# Patient Record
Sex: Female | Born: 1960 | Race: White | Hispanic: No
Health system: Southern US, Community
[De-identification: ages and names within clinical notes are randomized; demographics above are authoritative.]

---

## 2014-04-11 ENCOUNTER — Inpatient Hospital Stay: Payer: Self-pay | Admitting: Internal Medicine

## 2014-04-11 LAB — COMPREHENSIVE METABOLIC PANEL
Albumin: 3.6 g/dL (ref 3.4–5.0)
Alkaline Phosphatase: 92 U/L
Anion Gap: 29 — ABNORMAL HIGH (ref 7–16)
BILIRUBIN TOTAL: 2.1 mg/dL — AB (ref 0.2–1.0)
BUN: 11 mg/dL (ref 7–18)
CALCIUM: 9.1 mg/dL (ref 8.5–10.1)
Chloride: 97 mmol/L — ABNORMAL LOW (ref 98–107)
Co2: 12 mmol/L — ABNORMAL LOW (ref 21–32)
Creatinine: 0.95 mg/dL (ref 0.60–1.30)
EGFR (Non-African Amer.): >60
Glucose: 80 mg/dL (ref 65–99)
Osmolality: 274 (ref 275–301)
Potassium: 1.9 mmol/L — CL (ref 3.5–5.1)
SGOT(AST): 177 U/L — ABNORMAL HIGH (ref 15–37)
SGPT (ALT): 54 U/L (ref 12–78)
SODIUM: 138 mmol/L (ref 136–145)
Total Protein: 7.6 g/dL (ref 6.4–8.2)

## 2014-04-11 LAB — ETHANOL: Ethanol %: <0.003 % (ref 0.000–0.080)

## 2014-04-11 LAB — CBC
HCT: 33.2 % — ABNORMAL LOW (ref 35.0–47.0)
HGB: 11.1 g/dL — ABNORMAL LOW (ref 12.0–16.0)
MCH: 38.3 pg — AB (ref 26.0–34.0)
MCHC: 33.4 g/dL (ref 32.0–36.0)
MCV: 115 fL — AB (ref 80–100)
Platelet: 111 10*3/uL — ABNORMAL LOW (ref 150–440)
RBC: 2.89 10*6/uL — AB (ref 3.80–5.20)
RDW: 15.4 % — ABNORMAL HIGH (ref 11.5–14.5)
WBC: 5.7 10*3/uL (ref 3.6–11.0)

## 2014-04-11 LAB — DRUG SCREEN, URINE

## 2014-04-11 LAB — LIPASE, BLOOD: Lipase: 2186 U/L — ABNORMAL HIGH (ref 73–393)

## 2014-04-11 LAB — ACETAMINOPHEN LEVEL

## 2014-04-11 LAB — URINALYSIS, COMPLETE
BACTERIA: NONE SEEN
Bilirubin,UR: NEGATIVE
GLUCOSE, UR: NEGATIVE mg/dL (ref 0–75)
LEUKOCYTE ESTERASE: NEGATIVE
NITRITE: NEGATIVE
Ph: 6 (ref 4.5–8.0)
Protein: 100
RBC,UR: 1 /HPF (ref 0–5)
Specific Gravity: 1.017 (ref 1.003–1.030)
Squamous Epithelial: 12
WBC UR: 3 /HPF (ref 0–5)

## 2014-04-11 LAB — IRON: Iron: 159 ug/dL (ref 50–170)

## 2014-04-11 LAB — MAGNESIUM: MAGNESIUM: 1.1 mg/dL — AB

## 2014-04-11 LAB — OSMOLALITY: OSMOLALITY: 303 mosm/kg — AB (ref 275–295)

## 2014-04-11 LAB — SALICYLATE LEVEL: SALICYLATES, SERUM: 4.7 mg/dL — AB

## 2014-04-12 LAB — LIPID PANEL
CHOLESTEROL: 118 mg/dL (ref 0–200)
HDL Cholesterol: 43 mg/dL (ref 40–60)
LDL CHOLESTEROL, CALC: 54 mg/dL (ref 0–100)
TRIGLYCERIDES: 104 mg/dL (ref 0–200)
VLDL Cholesterol, Calc: 21 mg/dL (ref 5–40)

## 2014-04-12 LAB — BASIC METABOLIC PANEL
Anion Gap: 23 — ABNORMAL HIGH (ref 7–16)
BUN: 6 mg/dL — ABNORMAL LOW (ref 7–18)
Calcium, Total: 8 mg/dL — ABNORMAL LOW (ref 8.5–10.1)
Chloride: 103 mmol/L (ref 98–107)
Co2: 13 mmol/L — ABNORMAL LOW (ref 21–32)
Creatinine: 0.51 mg/dL — ABNORMAL LOW (ref 0.60–1.30)
EGFR (African American): >60
EGFR (Non-African Amer.): >60
GLUCOSE: 68 mg/dL (ref 65–99)
Osmolality: 273 (ref 275–301)
POTASSIUM: 2.2 mmol/L — AB (ref 3.5–5.1)
Sodium: 139 mmol/L (ref 136–145)

## 2014-04-12 LAB — LIPASE, BLOOD: LIPASE: 3080 U/L — AB (ref 73–393)

## 2014-04-12 LAB — SALICYLATE LEVEL: SALICYLATES, SERUM: 4.5 mg/dL — AB

## 2014-04-12 LAB — TSH: Thyroid Stimulating Horm: 2.61 u[IU]/mL

## 2014-04-12 LAB — CK: CK, TOTAL: 1404 U/L — AB

## 2014-04-12 LAB — MAGNESIUM: Magnesium: 3.8 mg/dL — ABNORMAL HIGH

## 2014-04-12 LAB — POTASSIUM: POTASSIUM: 3.1 mmol/L — AB (ref 3.5–5.1)

## 2014-04-13 LAB — BASIC METABOLIC PANEL
ANION GAP: 8 (ref 7–16)
BUN: 9 mg/dL (ref 7–18)
CALCIUM: 7.8 mg/dL — AB (ref 8.5–10.1)
CHLORIDE: 108 mmol/L — AB (ref 98–107)
CREATININE: 0.53 mg/dL — AB (ref 0.60–1.30)
Co2: 26 mmol/L (ref 21–32)
EGFR (African American): >60
EGFR (Non-African Amer.): >60
Glucose: 129 mg/dL — ABNORMAL HIGH (ref 65–99)
OSMOLALITY: 284 (ref 275–301)
Potassium: 2.8 mmol/L — ABNORMAL LOW (ref 3.5–5.1)
Sodium: 142 mmol/L (ref 136–145)

## 2014-04-13 LAB — URINE CULTURE

## 2014-04-13 LAB — HEPATIC FUNCTION PANEL A (ARMC)
AST: 99 U/L — AB (ref 15–37)
Albumin: 2.5 g/dL — ABNORMAL LOW (ref 3.4–5.0)
Alkaline Phosphatase: 61 U/L
BILIRUBIN TOTAL: 0.8 mg/dL (ref 0.2–1.0)
Bilirubin, Direct: 0.3 mg/dL — ABNORMAL HIGH (ref 0.00–0.20)
SGPT (ALT): 35 U/L (ref 12–78)
TOTAL PROTEIN: 5.5 g/dL — AB (ref 6.4–8.2)

## 2014-04-13 LAB — CK: CK, Total: 1502 U/L — ABNORMAL HIGH

## 2014-04-13 LAB — LIPASE, BLOOD: Lipase: 4860 U/L — ABNORMAL HIGH (ref 73–393)

## 2014-04-14 LAB — BASIC METABOLIC PANEL
Anion Gap: 9 (ref 7–16)
BUN: 4 mg/dL — AB (ref 7–18)
Calcium, Total: 7.3 mg/dL — ABNORMAL LOW (ref 8.5–10.1)
Chloride: 104 mmol/L (ref 98–107)
Co2: 28 mmol/L (ref 21–32)
Creatinine: 0.41 mg/dL — ABNORMAL LOW (ref 0.60–1.30)
EGFR (African American): >60
Glucose: 93 mg/dL (ref 65–99)
Osmolality: 278 (ref 275–301)
POTASSIUM: 2.9 mmol/L — AB (ref 3.5–5.1)
Sodium: 141 mmol/L (ref 136–145)

## 2014-04-14 LAB — CBC WITH DIFFERENTIAL/PLATELET
BASOS PCT: 0.7 %
Basophil #: 0 10*3/uL (ref 0.0–0.1)
EOS PCT: 0.6 %
Eosinophil #: 0 10*3/uL (ref 0.0–0.7)
HCT: 27.6 % — ABNORMAL LOW (ref 35.0–47.0)
HGB: 8.9 g/dL — ABNORMAL LOW (ref 12.0–16.0)
LYMPHS ABS: 1.2 10*3/uL (ref 1.0–3.6)
Lymphocyte %: 31.8 %
MCH: 36.7 pg — ABNORMAL HIGH (ref 26.0–34.0)
MCHC: 32.2 g/dL (ref 32.0–36.0)
MCV: 114 fL — ABNORMAL HIGH (ref 80–100)
MONOS PCT: 8.3 %
Monocyte #: 0.3 x10 3/mm (ref 0.2–0.9)
Neutrophil #: 2.2 10*3/uL (ref 1.4–6.5)
Neutrophil %: 58.6 %
Platelet: 76 10*3/uL — ABNORMAL LOW (ref 150–440)
RBC: 2.42 10*6/uL — ABNORMAL LOW (ref 3.80–5.20)
RDW: 15.6 % — ABNORMAL HIGH (ref 11.5–14.5)
WBC: 3.8 10*3/uL (ref 3.6–11.0)

## 2014-04-14 LAB — CK: CK, TOTAL: 2634 U/L — AB

## 2014-04-14 LAB — MAGNESIUM: Magnesium: 1 mg/dL — ABNORMAL LOW

## 2014-04-14 LAB — LIPASE, BLOOD: Lipase: 7312 U/L — ABNORMAL HIGH (ref 73–393)

## 2014-04-15 LAB — CBC WITH DIFFERENTIAL/PLATELET
BASOS ABS: 0 10*3/uL (ref 0.0–0.1)
Basophil %: 0.8 %
Eosinophil #: 0 10*3/uL (ref 0.0–0.7)
Eosinophil %: 0.4 %
HCT: 29.7 % — AB (ref 35.0–47.0)
HGB: 9.9 g/dL — ABNORMAL LOW (ref 12.0–16.0)
LYMPHS ABS: 1.4 10*3/uL (ref 1.0–3.6)
Lymphocyte %: 34.7 %
MCH: 37.6 pg — AB (ref 26.0–34.0)
MCHC: 33.4 g/dL (ref 32.0–36.0)
MCV: 112 fL — AB (ref 80–100)
MONOS PCT: 6.4 %
Monocyte #: 0.3 x10 3/mm (ref 0.2–0.9)
NEUTROS ABS: 2.4 10*3/uL (ref 1.4–6.5)
Neutrophil %: 57.7 %
Platelet: 98 10*3/uL — ABNORMAL LOW (ref 150–440)
RBC: 2.64 10*6/uL — ABNORMAL LOW (ref 3.80–5.20)
RDW: 15.2 % — ABNORMAL HIGH (ref 11.5–14.5)
WBC: 4.1 10*3/uL (ref 3.6–11.0)

## 2014-04-15 LAB — BASIC METABOLIC PANEL
ANION GAP: 7 (ref 7–16)
BUN: 2 mg/dL — AB (ref 7–18)
CO2: 26 mmol/L (ref 21–32)
Calcium, Total: 7.6 mg/dL — ABNORMAL LOW (ref 8.5–10.1)
Chloride: 104 mmol/L (ref 98–107)
Creatinine: 0.41 mg/dL — ABNORMAL LOW (ref 0.60–1.30)
EGFR (African American): >60
EGFR (Non-African Amer.): >60
GLUCOSE: 88 mg/dL (ref 65–99)
Osmolality: 269 (ref 275–301)
POTASSIUM: 3.7 mmol/L (ref 3.5–5.1)
SODIUM: 137 mmol/L (ref 136–145)

## 2014-04-15 LAB — OCCULT BLOOD X 1 CARD TO LAB, STOOL: Occult Blood, Feces: POSITIVE

## 2014-04-15 LAB — CK: CK, Total: 4364 U/L — ABNORMAL HIGH

## 2014-04-15 LAB — LIPASE, BLOOD: LIPASE: 4651 U/L — AB (ref 73–393)

## 2014-04-15 LAB — DRUG SCREEN, URINE
Amphetamines, Ur Screen: NEGATIVE (ref ?–1000)
BARBITURATES, UR SCREEN: NEGATIVE (ref ?–200)
Benzodiazepine, Ur Scrn: NEGATIVE (ref ?–200)
CANNABINOID 50 NG, UR ~~LOC~~: NEGATIVE (ref ?–50)
Cocaine Metabolite,Ur ~~LOC~~: NEGATIVE (ref ?–300)
MDMA (ECSTASY) UR SCREEN: NEGATIVE (ref ?–500)
Methadone, Ur Screen: NEGATIVE (ref ?–300)
Opiate, Ur Screen: NEGATIVE (ref ?–300)
PHENCYCLIDINE (PCP) UR S: NEGATIVE (ref ?–25)
Tricyclic, Ur Screen: NEGATIVE (ref ?–1000)

## 2014-04-15 LAB — FOLATE: FOLIC ACID: 3.4 ng/mL (ref 3.1–100.0)

## 2014-04-15 LAB — MAGNESIUM: Magnesium: 1.6 mg/dL — ABNORMAL LOW

## 2014-04-16 LAB — BASIC METABOLIC PANEL
Anion Gap: 4 — ABNORMAL LOW (ref 7–16)
BUN: 3 mg/dL — ABNORMAL LOW (ref 7–18)
CALCIUM: 8.1 mg/dL — AB (ref 8.5–10.1)
CREATININE: 0.53 mg/dL — AB (ref 0.60–1.30)
Chloride: 109 mmol/L — ABNORMAL HIGH (ref 98–107)
Co2: 24 mmol/L (ref 21–32)
EGFR (African American): >60
EGFR (Non-African Amer.): >60
Glucose: 80 mg/dL (ref 65–99)
OSMOLALITY: 269 (ref 275–301)
Potassium: 5 mmol/L (ref 3.5–5.1)
Sodium: 137 mmol/L (ref 136–145)

## 2014-04-16 LAB — CK: CK, TOTAL: 2887 U/L — AB

## 2014-04-16 LAB — WBCS, STOOL

## 2014-04-16 LAB — HEMOGLOBIN: HGB: 8.7 g/dL — AB (ref 12.0–16.0)

## 2014-04-17 LAB — HEMOGLOBIN: HGB: 8.4 g/dL — AB (ref 12.0–16.0)

## 2014-04-17 LAB — BASIC METABOLIC PANEL
Anion Gap: 8 (ref 7–16)
BUN: 3 mg/dL — ABNORMAL LOW (ref 7–18)
CALCIUM: 8.3 mg/dL — AB (ref 8.5–10.1)
CHLORIDE: 107 mmol/L (ref 98–107)
CREATININE: 0.59 mg/dL — AB (ref 0.60–1.30)
Co2: 24 mmol/L (ref 21–32)
EGFR (African American): >60
EGFR (Non-African Amer.): >60
Glucose: 81 mg/dL (ref 65–99)
Osmolality: 273 (ref 275–301)
Potassium: 4 mmol/L (ref 3.5–5.1)
Sodium: 139 mmol/L (ref 136–145)

## 2014-04-17 LAB — MAGNESIUM: MAGNESIUM: 1.8 mg/dL

## 2014-04-17 LAB — CK: CK, Total: 1155 U/L — ABNORMAL HIGH

## 2014-04-17 LAB — LIPASE, BLOOD: LIPASE: 1797 U/L — AB (ref 73–393)

## 2014-04-18 LAB — STOOL CULTURE

## 2015-01-08 IMAGING — CT CT ABD-PELV W/ CM
2 of 5 series · 16 of 46 positions shown, 18 images · IV contrast (agent unspecified)
Comparison: None.

CLINICAL DATA: Nausea, vomiting common diarrhea. Elevated lipase.
Metabolic acidosis. Leg swelling.

EXAM:
CT ABDOMEN AND PELVIS WITH CONTRAST
TECHNIQUE: Multidetector CT imaging of the abdomen and pelvis was performed
using the standard protocol following bolus administration of
intravenous contrast.
CONTRAST:  100 cc [id number]

[Series 2: routine abd pel with · axial · 0.71mm/px · z∈[-426,-52]mm · 13 of 87 slices shown, 15 images]
[im 6/87  soft-tissue]
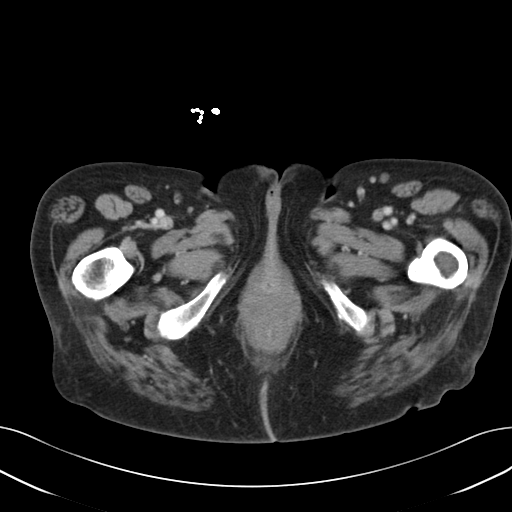
[im 6/87  bone]
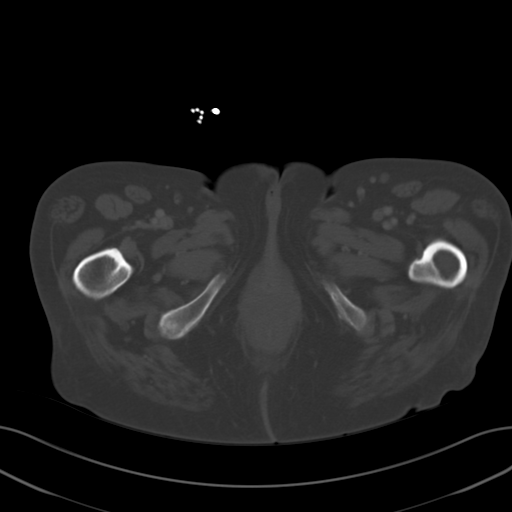
[im 11/87  soft-tissue]
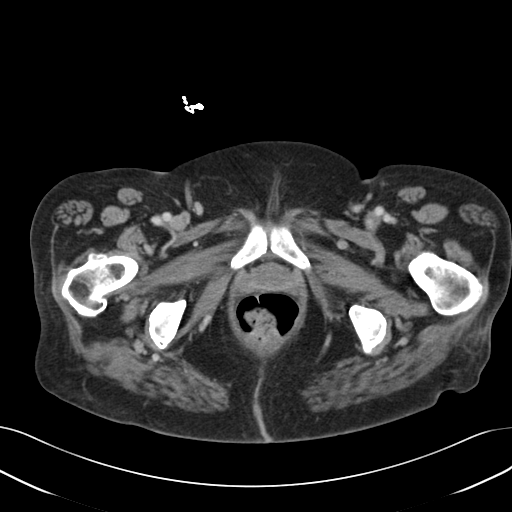
[im 21/87  soft-tissue]
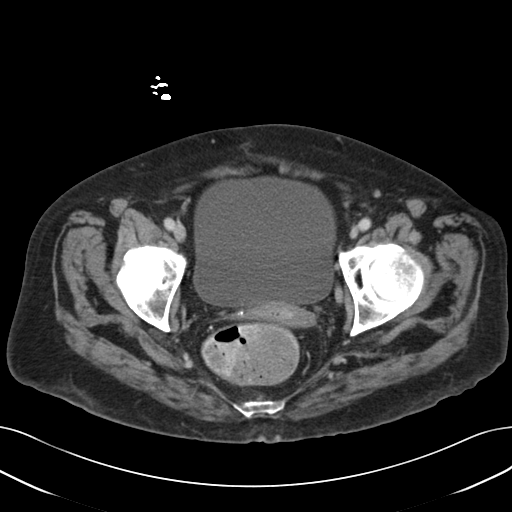
[im 26/87  soft-tissue]
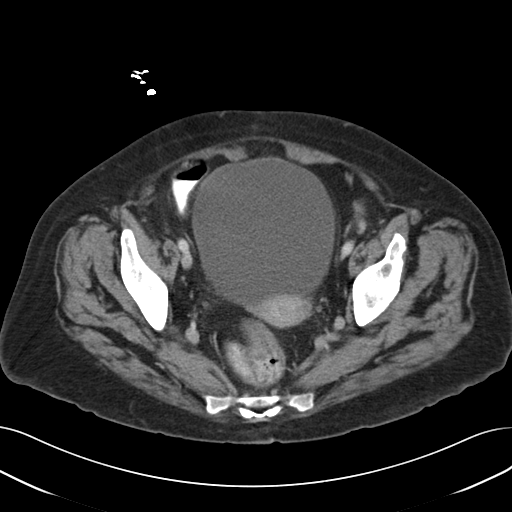
[im 31/87  soft-tissue]
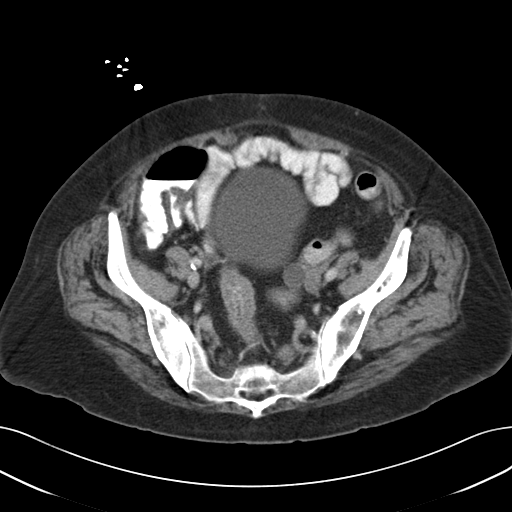
[im 36/87  soft-tissue]
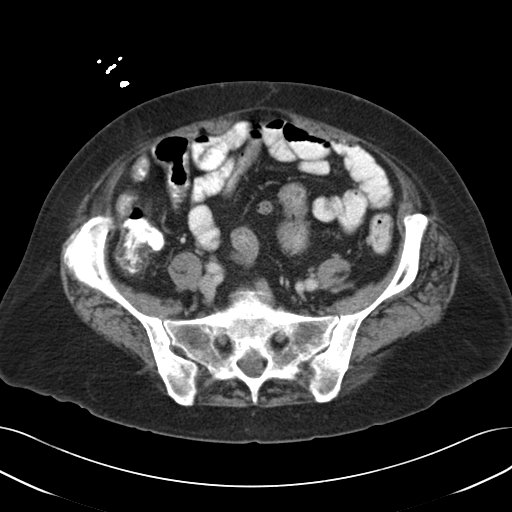
[im 46/87  soft-tissue]
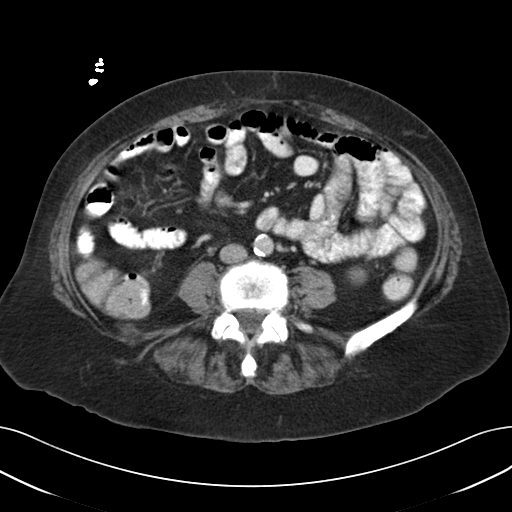
[im 51/87  soft-tissue]
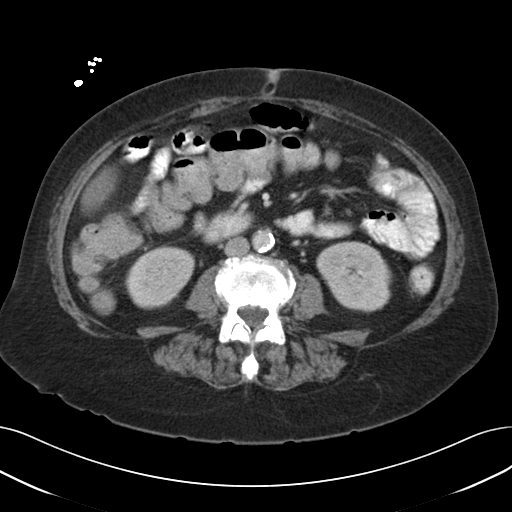
[im 56/87  soft-tissue]
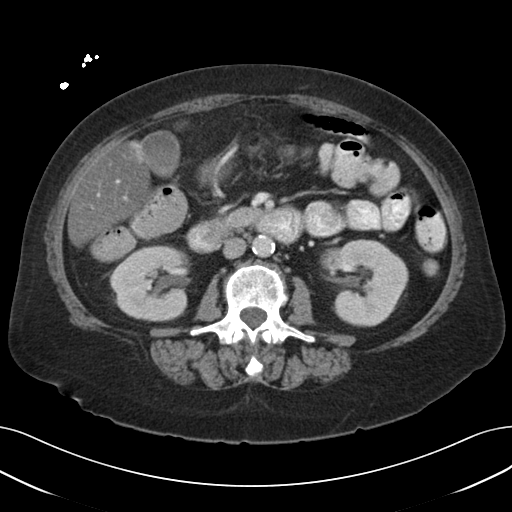
[im 56/87  bone]
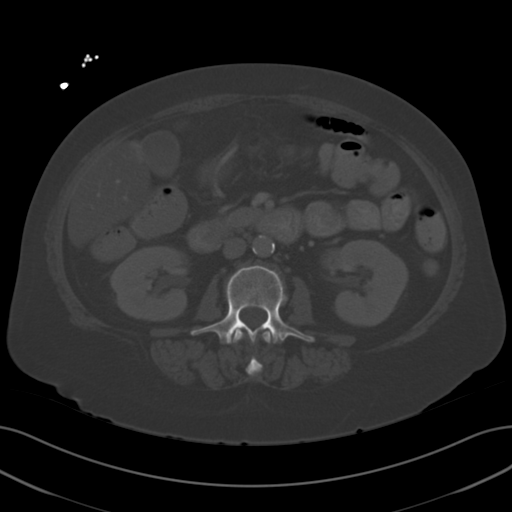
[im 61/87  soft-tissue]
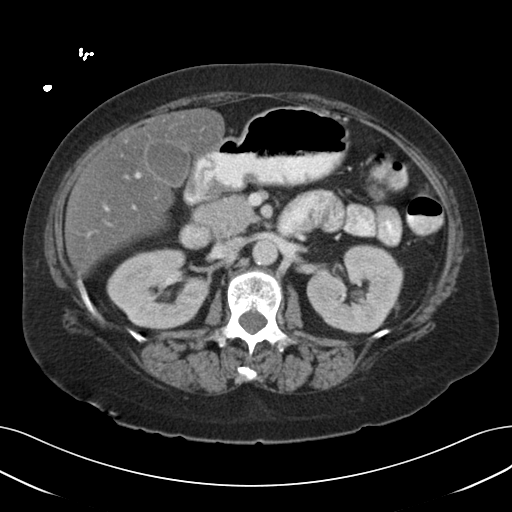
[im 66/87  soft-tissue]
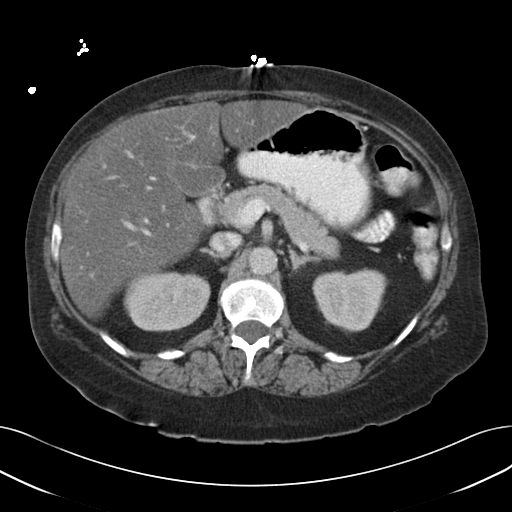
[im 76/87  soft-tissue]
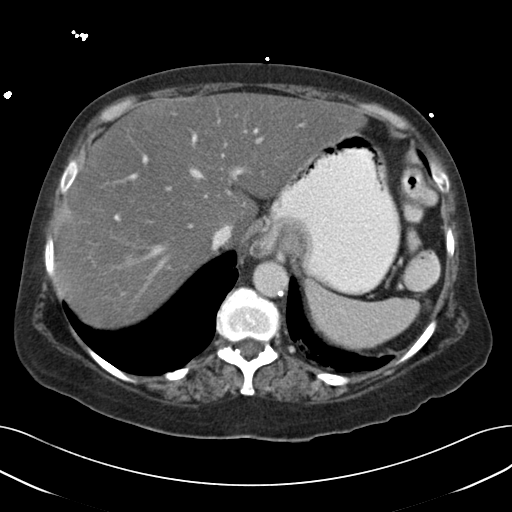
[im 81/87  soft-tissue]
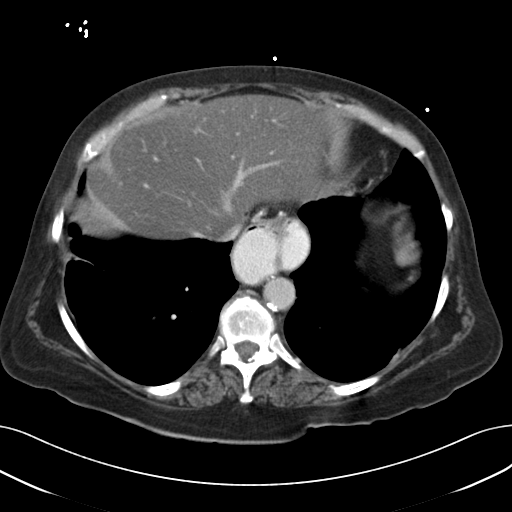

[Series 6: cor routine abd pel with · coronal · 0.68mm/px · 3 of 130 slices shown]
[im 44/130  soft-tissue]
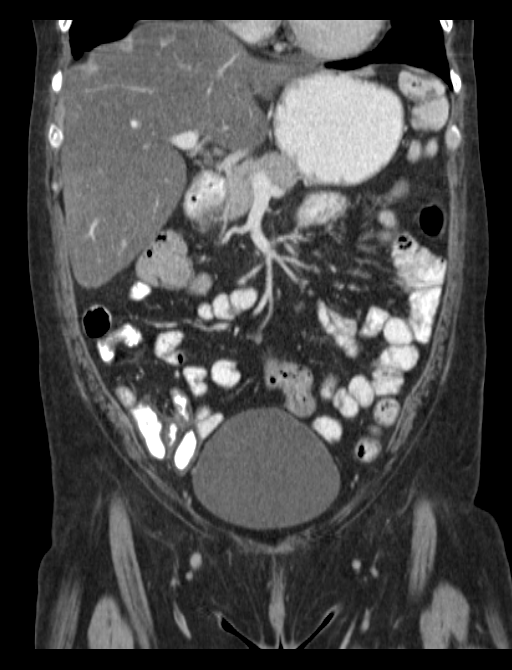
[im 58/130  soft-tissue]
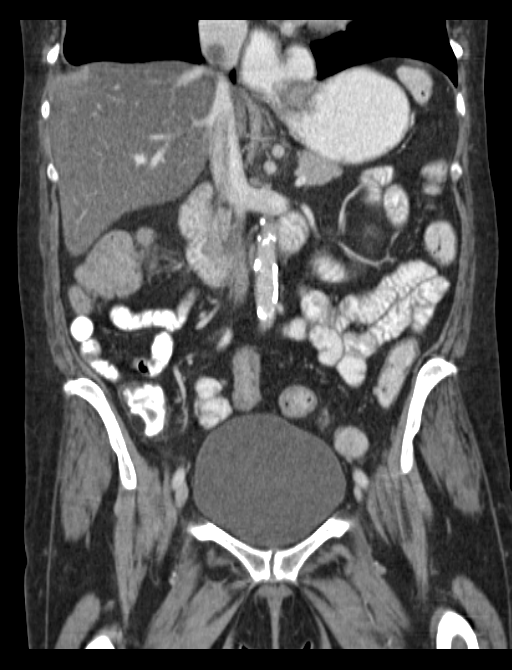
[im 72/130  soft-tissue]
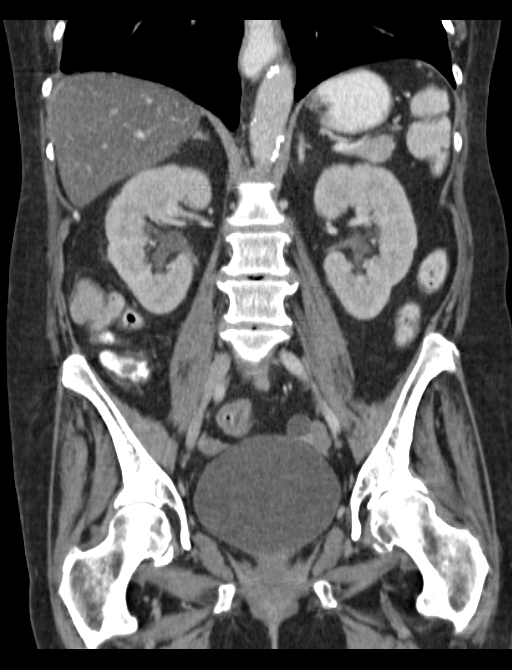

[16 of 46 positions shown; findings below may reference images not displayed]

FINDINGS: There is a moderate hiatal hernia. There is hepatomegaly with
extensive hepatic steatosis. No focal lesions. Solitary 12 mm
gallstone. No dilated bile ducts.

The spleen and pancreas appear normal. Adrenal glands and kidneys
are normal.

Bowel is normal except for a few diverticula in the sigmoid portion
of the colon. Terminal ileum and appendix are normal. Uterus is
normal. There is a 9 mm cyst on the otherwise normal right ovary and
a 15 mm cyst on the otherwise normal left ovary. Bladder is normal.
No acute osseous abnormality.
IMPRESSION: 1. Hepatomegaly with diffuse hepatic steatosis.
2. Cholelithiasis.  No bile duct dilatation.
3. Normal pancreas with no peripancreatic soft tissue stranding or
fluid. No pancreatic duct dilatation.
4. Moderate hiatal hernia.

## 2015-01-11 IMAGING — CR DG ABDOMEN 1V
1 series · 1 of 1 positions shown · non-contrast
Comparison: CT of 04/11/2014

CLINICAL DATA: Diarrhea for 2 months.  Nausea and abdominal pain.

EXAM:
ABDOMEN - 1 VIEW

[supine kub]
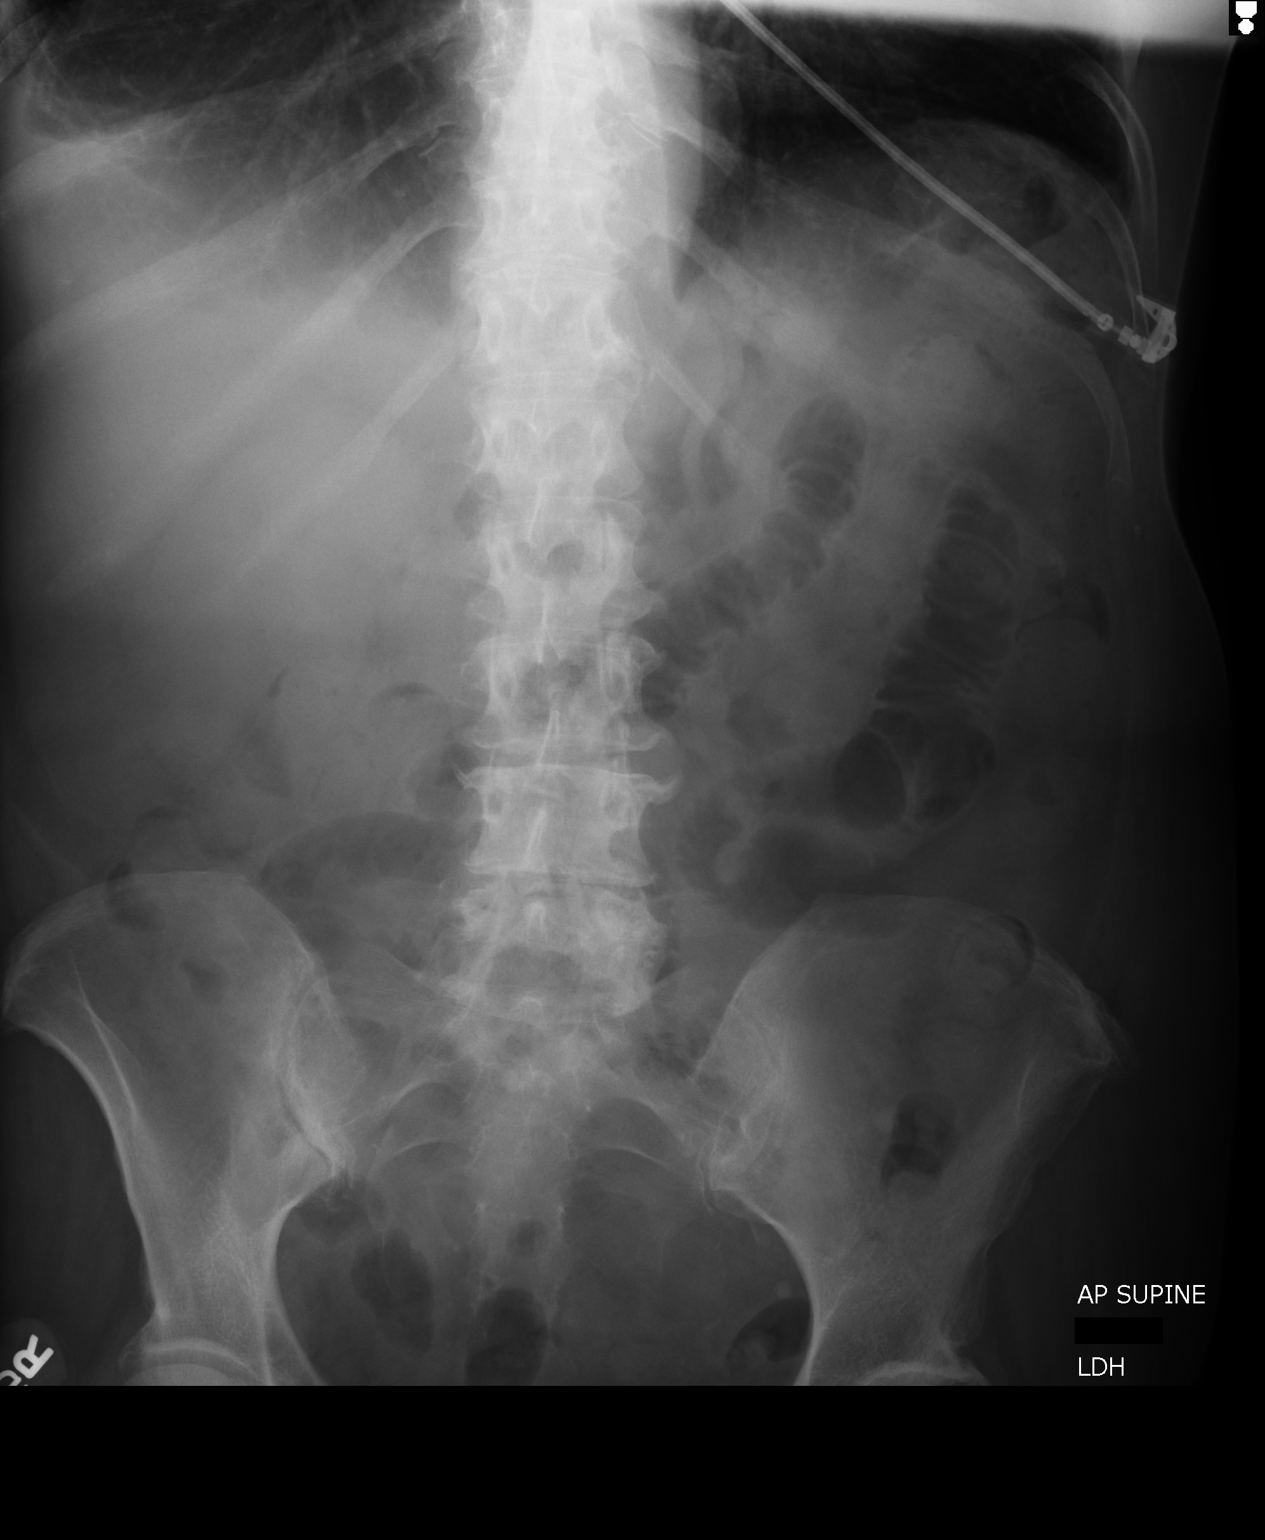

[1 of 1 positions shown; findings below may reference images not displayed]

FINDINGS: Supine view of the abdomen and pelvis. No bowel distension. No free
intraperitoneal air. No pneumatosis. No abnormal abdominal
calcifications. No appendicolith. Distal gas and stool.
IMPRESSION: No acute findings.

## 2015-02-09 NOTE — Consult Note (Signed)
Consult dictated.  I spoke to Dr. Juliene PinaMody about assessment. Rhabdomyolysis of unknown cause with rising Lipase without clinical evidence of pancreatitis.  Hx of alcohol use, drug screen neg.  Hx of anxiety spells, panic attacks, feeling spaced out at work.  Recommend continued iv supplementation with Mg and K and psychiatry consult,  Would search room for any meds she may be taking we are not aware of.  Electronic Signatures: Scot JunElliott, Robert T (MD)  (Signed on 27-Jun-15 14:22)  Authored  Last Updated: 27-Jun-15 14:22 by Scot JunElliott, Robert T (MD)

## 2015-02-09 NOTE — Consult Note (Signed)
PATIENT NAME:  ELHAM, FINI MR#:  [id number] DATE OF BIRTH:  1960/11/26  DATE OF CONSULTATION:  04/14/2014  CONSULTING PHYSICIAN:  Scot Jun, MD  HISTORY OF PRESENT ILLNESS: The patient is a 54 year old white female who was admitted to the hospital due to lower extremity swelling, severe pain in the left lower extremity and severe hypomagnesemia and hypokalemic. I was asked to see her for very elevated lipase test over 7000.   The patient is a 54 year old white female with history of alcohol use. She drinks Seagram's 7, somewhat vague about how much. She developed decreased appetite, nausea, vomiting and vague abdominal discomfort, came to the ER because of weakness and inability to walk. Dopplers were negative for DVTs. CT of the abdomen and pelvis showed abnormality, moderate hiatal hernia, hepatomegaly with extensive hepatic steatosis, a solitary 12 mm gallstone and a normal pancreas. Since admission, she has had supplementation with magnesium and potassium, and her CPK has risen significantly, giving a picture of rhabdomyolysis of unknown origin.   I questioned the patient about nutritional supplements, mushroom ingestion, street drugs, taking other medications, and could get no history for any of this. No history of fever. No history of viral illness.   HABITS: Smokes 1/2 pack a day. Drinks alcohol, Seagram's 7, uncertain amount.   PAST MEDICAL HISTORY: Includes a C-section. She said that she lost a child.   PAST MEDICAL HISTORY: She says that she injured her foot at work by stepping down wrongly, and she was scared of hurting herself again, and this would cause her to have panic attacks and anxiety spells for fear of hurting herself again at work. She works for [employer] for [employer] and contracted through [employer].   She has never had a colonoscopy or upper endoscopy. She does not have a regular doctor.   She does feel a little disoriented and anxiety spells at work.  REVIEW  OF SYSTEMS: Otherwise is negative per hospitalist.   PHYSICAL EXAMINATION:  GENERAL: White female in no acute distress.  HEENT: Sclerae nonicteric. Conjunctivae negative. Head is atraumatic.  CHEST: Clear.  HEART: Shows no murmurs, gallops, clicks or rubs.  ABDOMEN: No significant abdominal tenderness. Bowel sounds present. No palpable masses. No bruits. SKIN: Warm and dry.  EXTREMITIES: Show no edema.  VITAL SIGNS: Blood pressure 152/88, pulse 64, temperature 98.4.   LABORATORY DATA: Today, potassium 2.9, magnesium 1.0, calcium 7.3, lipase 7312, CPK total is 2634, total protein 5.5, albumin 2.5, total bilirubin 0.8, direct bilirubin 0.3, alkaline phosphatase 61, SGOT 99, SGPT 35. Urine drug screen done on June 24 was negative. CBC shows white blood cells 3.8, hemoglobin 8.9, hematocrit 27.6, platelet count 76, MCV 114. Salicylate level on June 25 was 4.3.   ASSESSMENT: The patient has story of stepping wrong and hurting her foot and then having subsequent panic attacks and anxiety spells, without prior occurrence. She has a history of alcohol intake, is vague on the amount. She denies ever having a DUI or DWI. She denies any nutritional supplements or prescription medication. She presents with probable rhabdomyolysis syndrome with severe hypokalemia, hypomagnesemia, followed by a rising CPK and lipase of uncertain origin. She also has hepatic steatosis, but with minimal elevation of LFTs. The origin of this is somewhat confusing   RECOMMENDATIONS:  1. I would do a room search to make sure she is not taking any medications or substances while here in the hospital.  2. I would get a psychiatry consult because of her spells of panic  attacks and anxiety.  3. I would closely follow her labs, as you are doing, because it appears that the rhabdomyolysis is not over yet.  4. I will follow with you.   ____________________________ Scot Junobert T. Elliott, MD rte:lb D: 04/14/2014 14:18:27 ET T: 04/14/2014  14:36:13 ET JOB#: [id number]  cc: Scot Junobert T. Elliott, MD, <Dictator> Sital P. Juliene PinaMody, MD Susa GriffinsPadmaja Vasireddy, MD Scot JunOBERT T ELLIOTT MD ELECTRONICALLY SIGNED 05/01/2014 17:40

## 2015-02-09 NOTE — Consult Note (Signed)
My best estimate is that patient had rhabdomyoslysis from alcohol.  I explained to her that this was a very serious condition and if she continued to drink heavily she would die within a year.  She has a rectal small polyp and needs a colonoscopy sometime this year.  She has diarrhea of uncertain origin with neg stool studies.She wishes to go home and if she does we will follow her up as out pt if she makes an appointment. i will sign off, reconsult if needed.  Take Lomotil prn for diarrhea.  Electronic Signatures: Scot JunElliott, Robert T (MD)  (Signed on 30-Jun-15 07:15)  Authored  Last Updated: 30-Jun-15 07:15 by Scot JunElliott, Robert T (MD)

## 2015-02-09 NOTE — Consult Note (Signed)
Pt with multiple loose stools during the night.  Will do flex sig today for this problem with a fleets enema before hand.  Electronic Signatures: Scot JunElliott, Robert T (MD)  (Signed on 29-Jun-15 13:21)  Authored  Last Updated: 29-Jun-15 13:21 by Scot JunElliott, Robert T (MD)

## 2015-02-09 NOTE — Consult Note (Signed)
stool cultures neg, stool neg for WBC's.  Electronic Signatures: Scot JunElliott, Robert T (MD)  (Signed on 29-Jun-15 18:14)  Authored  Last Updated: 29-Jun-15 18:14 by Scot JunElliott, Robert T (MD)

## 2015-02-09 NOTE — Consult Note (Signed)
Pt with loose stools during night, 4-5 times she reports, 2 BM's this morning more formed.  No abd pain or tenderness, "I feel great today"  urine drug screen neg. Denies nausea, vomiting, chest pain, WBC 4.1, CPK 4364, lipase 4651, abd not tender, hgb 9.9, plt 98K  Mg. 1.6, folic acid normal.  I do not suspect pancreatitis, but I cannot explain the elevated lipase.  would recommend keep one or two more days to monitor Mg and K.  Will get stool studies and consider colonoscopy at some time if diarrhea continues.  Electronic Signatures: Scot JunElliott, Braylin Xu T (MD)  (Signed on 28-Jun-15 12:45)  Authored  Last Updated: 28-Jun-15 12:45 by Scot JunElliott, Abrial Arrighi T (MD)

## 2015-02-09 NOTE — H&P (Signed)
PATIENT NAME:  Jodi Terry, Jodi Terry MR#:  [id number] DATE OF BIRTH:  [birth year]  DATE OF ADMISSION:  04/11/2014  PRIMARY CARE PHYSICIAN: None.   REFERRING PHYSICIAN: Dr. Sharman CheekPhillip Stafford.   CHIEF COMPLAINT:  Both lower extremity pain.   HISTORY OF PRESENT ILLNESS: Jodi Terry is a 54 year old female with no past medical history, and history of alcohol use, comes to the Emergency Department with complaints of lower extremity swelling, severe pain in the left lower extremity, greater than right. The patient states experiences pain with any movement, feels her legs are really hard to palpate and to touch.  The patient states unable to walk secondary to pain. For the last one week, the patient has been having decreased appetite, associated with a few episodes of vomiting. The patient states last alcohol use was about one week back. Concerning this, patient came to the Emergency Department. Workup in the Emergency Department with lower extremity Doppler's was negative for any DVT. Further lab workup revealed patient has lipase of 2100, has potassium of 1.9 and magnesium of 1.1. The patient states unable to eat any food for the last one week. CT abdomen and pelvis was done in the Emergency Department, did not show any acute abnormality.   PAST MEDICAL HISTORY: None.   PAST SURGICAL HISTORY: None.   ALLERGIES: No known drug allergies.   HOME MEDICATIONS: None.   SOCIAL HISTORY: Continues to smoke a 1/2 pack a day. Drinks alcohol; states usually over the weekends, however, also drinks during the weekdays. Denies using any illicit drugs. Does not have children. Is not in contact with any family members.  Usually drinks alcohol along with her friends. 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   FAMILY HISTORY: Mother died of bone cancer. Father died of emphysema.   REVIEW OF SYSTEMS: CONSTITUTIONAL: Experiences generalized weakness, some weight loss.  EYES: No change in vision.  ENT: No change in  hearing.  RESPIRATORY: Has mild cough, nothing worse than usual. No shortness of breath.  CARDIOVASCULAR: No chest pain, palpations.  GASTROINTESTINAL: Has nausea, vomiting, some abdominal pain in the epigastric area, mild diarrhea.  GENITOURINARY: No dysuria or hematuria.  HEMATOLOGIC: No easy bruising or bleeding.  SKIN: No rash or lesions.  MUSCULOSKELETAL: Has been experiencing pain in both lower extremities.  NEUROLOGIC: No weakness or numbness in any part of the body.   PHYSICAL EXAMINATION: GENERAL: This is a well-built, well-nourished, age-appropriate female, lying down in the bed, not in distress.  VITAL SIGNS: Temperature is 98, pulse 167/86, respiratory rate of 18, oxygen saturation 100% on room air.  HEENT: Head normocephalic, atraumatic. No scleral icterus. Conjunctivae normal. Pupils equal and react to light. Extraocular movements are intact. Mucous membranes: Mild dryness. No pharyngeal erythema.  NECK: Supple. No lymphadenopathy. No JVD. No carotid bruits.  CHEST: Has no focal tenderness.  LUNGS: Bilaterally clear to auscultation.  HEART: S1, S2 regular. No murmurs are heard.  ABDOMEN: Bowel sounds present. Soft. Has tenderness in the epigastric area. Could not appreciate any hepatosplenomegaly. No rebound or guarding.  EXTREMITIES: No pedal edema. Pulses 2+.  SKIN: No rash or lesions.  MUSCULOSKELETAL: Good range of motion in all the extremities.  NEUROLOGIC: The patient is alert, oriented to place, person and time. Cranial nerves II through XII intact. Motor 5/5 in upper and lower extremities.   LABORATORIES: Magnesium 1.1. CT abdomen and pelvis, hepatosplenomegaly with diffuse hepatosteatosis, cholelithiasis, no bile duct dilatation. Normal pancreas with normal pancreatic soft tissue stranding. Urine drug screen  is negative. UA negative for nitrites and leukocyte esterase. Ultrasound of the lower extremities negative for any DVT. Lactic acid level of 2.2. Chest x-ray, one  view portable: COPD with mild blunting of the right costophrenic angle. CBC: WBC of 5.7, hemoglobin 11.1, platelet count of 111. CMP:  Potassium of 1.9, total bilirubin of 2.1, AST 177.   ASSESSMENT AND PLAN: Jodi Terry is a 54 year old female who comes to the Emergency Department with acute pancreatitis, most likely alcohol-induced.  1.  Acute pancreatitis; as mentioned alcohol-induced; however, the patient also has cholelithiasis. Patient has elevated total bilirubin of 2.1 with elevated AST of 177. Again, this could be secondary to alcoholic hepatitis. The patient does not have any Murphy's sign at this time. Continue with intravenous fluids. The patient does not have any pain, will not start on any pain medications for now.  2.   Hypokalemia: Will replace by intravenous and by mouth if patient tolerates.  3.   Lower extremity pain, most likely secondary to cramping from low potassium. Replenish the electrolytes and followup. We will also obtain CPK levels. The patient also could be having alcohol-induced myopathy.  4.   Hypomagnesemia: Will replace intravenously.   5.  Alcohol abuse: Counseled with the patient; seems to have poor insight. Will keep the patient on thiamine.  6.   Continued tobacco use. Counseled with the patient.  7.   Keep the patient on deep vein thrombosis prophylaxis with Lovenox.   TIME SPENT: 55 minutes.    ____________________________ Susa Griffins, MD pv:aj D: 04/11/2014 23:52:15 ET T: 04/12/2014 02:46:06 ET JOB#: [id number]  cc: Susa Griffins, MD, <Dictator> Clerance Lav VASIREDDY MD ELECTRONICALLY SIGNED 04/12/2014 21:00

## 2015-02-09 NOTE — Consult Note (Signed)
Patient has a small rectal polyp and she needs a complete colonoscopy to check for other possible polyps.  She wants to go home and do it later as an out patient.  Will advance diet and she can follow up in our office after discharge.  Electronic Signatures: Scot JunElliott, Robert T (MD)  (Signed on 29-Jun-15 18:13)  Authored  Last Updated: 29-Jun-15 18:13 by Scot JunElliott, Robert T (MD)

## 2015-02-09 NOTE — Discharge Summary (Signed)
PATIENT NAME:  Jodi Terry, Jodi Terry MR#:  [id number] DATE OF BIRTH:  [birth year]  DATE OF ADMISSION:  04/11/2014 DATE OF DISCHARGE:  04/17/2014  ADMISSION DIAGNOSIS: Acute pancreatitis.  DISCHARGE DIAGNOSES:  1.  Elevated lipase felt not to be acute pancreatitis but possibly due to rhabdomyolysis.  2.  Rhabdomyolysis.  3.  Alcohol dependence.  4.  Diarrhea.  5.  Anxiety.  6.  Hypokalemia and hypomagnesemia.   CONSULTATIONS:  1.  Psychiatry. 2.  Dr. Mechele CollinElliott.   PROCEDURES: The patient underwent a sigmoidoscopy on April 16, 2014 which showed a small sessile polyp.  Discharge lipase 1797. CK 1155. Hemoglobin 8.4.   HOSPITAL COURSE: This is a 54 year old female who presented on June 24th with lower extremity pain, was found to have significant hypokalemia and elevated lipase. For further details, please refer to the H and P.  1.  Elevated lipase. The patient was found to have an elevated lipase. She had a CT scan on admission, which did not show any evidence of pancreatitis. Initially this was thought to be due to pancreatitis, although the patient never demonstrated any classical symptoms of pancreatitis. I had GI see her as the patient's lipase kept trending up despite being on n.p.o. status. GI did not feel this was pancreatitis, but probably due to rhabdomyolysis. Her lipase level has improved as her CK has improved.  2.  Nausea, which is improved. 3.  Severe hypokalemia and magnesium secondary to rhabdomyolysis as well as alcohol abuse, which was repleted and improved.  4.  Rhabdomyolysis from alcohol abuse. Her CPK is improved.  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 She was provided bicarb. She had metabolic acidosis. This has all resolved.  6.  Thrombocytopenia due to alcohol dependence.  7.  Anxiety disorder. The patient was seen by psychiatry. The patient did not want further work-up.  8.  Diarrhea. The patient had guaiac-positive stools. Her stool cultures were  negative including C. diff. She underwent a flex sigmoidoscopy with a sessile polyp. The patient will need outpatient colonoscopy in 1 year.   DISCHARGE MEDICATIONS: Magnesium oxide 1 tablet daily.   DISCHARGE DIET: Regular diet.   DISCHARGE ACTIVITY: As tolerated.   DISCHARGE FOLLOWUP: The patient will follow up in 4 weeks with Dr. Mechele CollinElliott.  The patient is stable for discharge.   TIME SPENT: About 35 minutes.   ____________________________ Janyth ContesSital P. Juliene PinaMody, MD spm:sb D: 04/17/2014 12:01:24 ET T: 04/17/2014 12:23:49 ET JOB#: [id number]  cc: Sital P. Juliene PinaMody, MD, <Dictator> Scot Junobert T. Elliott, MD Janyth ContesSITAL P MODY MD ELECTRONICALLY SIGNED 04/18/2014 13:37

## 2015-02-09 NOTE — Consult Note (Signed)
Brief Consult Note: Diagnosis: Adjustment disorder with anxiety.   Patient was seen by consultant.   Consult note dictated.   Recommend further assessment or treatment.   Comments: Ms. Jodi Terry has remote history of panic attacks. She denies any current symptoms that would require attention. She feels slightly anxious because she wants to go home. She declines pharamacotherapy or psychotherapy.  Electronic Signatures: Kristine LineaPucilowska, Jolanta (MD)  (Signed 29-Jun-15 15:22)  Authored: Brief Consult Note   Last Updated: 29-Jun-15 15:22 by Kristine LineaPucilowska, Jolanta (MD)

## 2015-11-20 DEATH — deceased
